# Patient Record
Sex: Male | Born: 2000 | Race: White | Hispanic: No | Marital: Single | Smoking: Never smoker
Health system: Southern US, Community
[De-identification: ages and names within clinical notes are randomized; demographics above are authoritative.]

## PROBLEM LIST (undated history)

## (undated) DIAGNOSIS — K900 Celiac disease: Secondary | ICD-10-CM

## (undated) DIAGNOSIS — D649 Anemia, unspecified: Secondary | ICD-10-CM

## (undated) DIAGNOSIS — Z91018 Allergy to other foods: Secondary | ICD-10-CM

## (undated) HISTORY — DX: Celiac disease: K90.0

## (undated) HISTORY — DX: Anemia, unspecified: D64.9

## (undated) HISTORY — DX: Allergy to other foods: Z91.018

---
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
## 2021-01-15 ENCOUNTER — Ambulatory Visit (HOSPITAL_COMMUNITY)
Admission: RE | Admit: 2021-01-15 | Discharge: 2021-01-15 | Disposition: A | Discharge status: Home / Self Care | Payer: Medicaid Other | Attending: Internal Medicine | Admitting: Internal Medicine

## 2021-01-15 ENCOUNTER — Encounter (HOSPITAL_COMMUNITY)
Admission: RE | Disposition: A | Discharge status: Home / Self Care | Payer: Self-pay | Source: Home / Self Care | Attending: Internal Medicine

## 2021-01-15 DIAGNOSIS — R109 Unspecified abdominal pain: Secondary | ICD-10-CM | POA: Insufficient documentation

## 2021-01-15 DIAGNOSIS — R933 Abnormal findings on diagnostic imaging of other parts of digestive tract: Secondary | ICD-10-CM | POA: Insufficient documentation

## 2021-01-15 DIAGNOSIS — G8929 Other chronic pain: Secondary | ICD-10-CM | POA: Diagnosis not present

## 2021-01-15 DIAGNOSIS — K900 Celiac disease: Secondary | ICD-10-CM

## 2021-01-15 HISTORY — PX: GIVENS CAPSULE STUDY: SHX5432

## 2021-01-15 SURGERY — IMAGING PROCEDURE, GI TRACT, INTRALUMINAL, VIA CAPSULE

## 2021-01-17 ENCOUNTER — Encounter (HOSPITAL_COMMUNITY): Payer: Self-pay | Admitting: Internal Medicine

## 2021-01-21 ENCOUNTER — Telehealth: Payer: Self-pay | Admitting: Internal Medicine

## 2021-01-21 ENCOUNTER — Other Ambulatory Visit: Payer: Self-pay

## 2021-01-21 DIAGNOSIS — K900 Celiac disease: Secondary | ICD-10-CM

## 2021-01-21 NOTE — Telephone Encounter (Signed)
Released lab orders and called pt to inform lab.

## 2021-01-21 NOTE — Telephone Encounter (Signed)
Pt said he was at [facility] labs and they dont have his lab orders. Fax [phone]

## 2021-01-22 ENCOUNTER — Telehealth: Payer: Self-pay | Admitting: Gastroenterology

## 2021-01-22 LAB — IGA: Immunoglobulin A: 235 mg/dL (ref 47–310)

## 2021-01-22 LAB — TISSUE TRANSGLUTAMINASE, IGA: (tTG) Ab, IgA: 22.2 U/mL — ABNORMAL HIGH

## 2021-01-22 NOTE — Telephone Encounter (Signed)
Gary [name], please let patient know I have reviewed his capsule report. I was waiting to look at a few images with Dr. Gala Romney.   No obvious abnormal villi seen. No obvious ulcerations or signs of inflammatory bowel disease. No mass appreciated. Full report to follow.  Brett Fairy.

## 2021-01-22 NOTE — Telephone Encounter (Signed)
noted 

## 2021-01-23 ENCOUNTER — Telehealth: Payer: Self-pay | Admitting: Internal Medicine

## 2021-01-23 NOTE — Telephone Encounter (Signed)
Patient called asking if his capsule study has been read and he also has a question about his labs.  Concerned because something is abnormal.

## 2021-01-23 NOTE — Telephone Encounter (Signed)
Spoke with Gary [name] and informed him that Katrine Coho and Dr. Gala Romney are reviewing all of his results and that we would be back in touch with him today with the final results. Gary [name] verbalized understanding.

## 2021-01-24 NOTE — Procedures (Addendum)
Small Bowel Givens Capsule Study Procedure date:  01/15/21  Referring Provider:  Tana Coast, PA-C/Dr. Jena Gauss  PCP:  Dr. Vernice Jefferson, Helane Rima, PA-C  Indication for procedure:   21 year old male with history of celiac disease via serologies (no confirmed biopsies), chronic abdominal pain, and diarrhea, presenting for capsule study due to history of abnormal distal small bowel on CT (somewhat nonspecific appearance but had plans for video capsule to evaluate for possible inflammatory bowel disease)    Findings:   Capsule was complete to the cecum. Ampulla readily recognizable. No obvious mass. No bleeding. No obvious ulcers or erosions.   First Gastric image:  00:00:45 First Duodenal image: 01:40:22 First Cecal image: 04:26:12 Gastric Passage time: 1h 67m Small Bowel Passage time:  2h 26m  Summary & Recommendations: Pleasant 21 year old male with presumed celiac disease due to positive serologies, chronic abdominal pain, and diarrhea, now with essentially unrevealing capsule study of small intestine. If persistent diarrhea despite gluten avoidance and supportive measures, recommend colonoscopy/EGD with biopsies.   Will arrange follow-up in the future as an outpatient. Continue gluten-free diet.   Gelene Mink, PhD, ANP-BC [facility]   Attending note: Pertinent images reviewed.  TTG IgA level remains elevated.  Agree with continuing treatment for celiac disease.

## 2021-01-24 NOTE — Telephone Encounter (Signed)
Pt was advised of this by Tammy and he also called back and spoke with me and I advised we would call when the Dr's have gotten together regarding the pt's next step. The pt expressed understanding.

## 2021-01-29 NOTE — Telephone Encounter (Signed)
Message sent to patient via mychart

## 2021-02-01 ENCOUNTER — Telehealth: Payer: Self-pay | Admitting: Gastroenterology

## 2021-02-01 DIAGNOSIS — K900 Celiac disease: Secondary | ICD-10-CM

## 2021-02-01 NOTE — Telephone Encounter (Signed)
[facility] in [place] should be able to see them in their system, but to be on the safe side I can mail the orders to him as well. What labs would you like to be ordered and what dx codes would you want to use?

## 2021-02-01 NOTE — Telephone Encounter (Signed)
He needs:   Test code [phone] (Endomysial Antibody (IgA) Screen with Reflex to Titer) Test code [phone] (HLA typing for Celiac Disease)  Dx: celiac disease, diarrhea, FH celiac disease   Tammy, [id number] is not in epic unfortunately.

## 2021-02-01 NOTE — Telephone Encounter (Signed)
See mychart message from patient.   He would like to have his labs done at [facility] in [place]. [address], [place] [address].  Can we make arrangements? Not sure if we need to fax that lab, mail him orders (he is currently at college), or if we just put in system and they can see it???

## 2021-02-01 NOTE — Addendum Note (Signed)
Addended by: Mahala Menghini on: 02/01/2021 04:41 PM   Modules accepted: Orders

## 2021-02-07 ENCOUNTER — Other Ambulatory Visit: Payer: Self-pay

## 2021-02-07 DIAGNOSIS — K900 Celiac disease: Secondary | ICD-10-CM

## 2021-02-07 NOTE — Telephone Encounter (Signed)
Spoke with rep from [facility] and was advised to order the requested test via prescription pad. Faxed both orders to [facility] with pt and he is scheduled to have them done on Friday 02/08/21.

## 2021-02-12 ENCOUNTER — Telehealth: Payer: Self-pay

## 2021-02-12 DIAGNOSIS — K900 Celiac disease: Secondary | ICD-10-CM

## 2021-02-12 NOTE — Telephone Encounter (Signed)
New referral placed.

## 2021-02-12 NOTE — Addendum Note (Signed)
Addended by: Cheron Every on: 02/12/2021 12:18 PM   Modules accepted: Orders

## 2021-02-12 NOTE — Telephone Encounter (Signed)
Patient has an upcoming appointment with nutrition and is needing an updated referral sent over. Gary [name] referred him to nutrition back in March. Is it ok to move forward with this?

## 2021-02-15 LAB — HLA TYPING FOR CELIAC DISEASE
HLA-DQ2: POSITIVE
HLA-DQ8: NEGATIVE
HLA-DQA1*: 2
HLA-DQA1*: 5
HLA-DQB1*: 201
HLA-DQB1*: 303

## 2021-02-15 LAB — ENDOMYSIAL AB IGA RFLX TITER: Endomysial Ab IgA: POSITIVE — AB

## 2021-02-15 LAB — ENDOMYSIAL AB IGA TITER: Endomysial Titer: 1:5 {titer} — ABNORMAL HIGH

## 2021-02-20 ENCOUNTER — Encounter: Payer: Medicaid Other | Admitting: Nutrition

## 2021-02-22 ENCOUNTER — Telehealth: Payer: Medicaid Other | Admitting: Physician Assistant

## 2021-02-22 DIAGNOSIS — R233 Spontaneous ecchymoses: Secondary | ICD-10-CM

## 2021-02-22 NOTE — [facility] Notes (Signed)
Virtual Visit Consent   Gary [name], you are scheduled for a virtual visit with a [facility] provider today.     Just as with appointments in the office, your consent must be obtained to participate.  Your consent will be active for this visit and any virtual visit you may have with one of our providers in the next 365 days.     If you have a MyChart account, a copy of this consent can be sent to you electronically.  All virtual visits are billed to your insurance company just like a traditional visit in the office.    As this is a virtual visit, video technology does not allow for your provider to perform a traditional examination.  This may limit your provider's ability to fully assess your condition.  If your provider identifies any concerns that need to be evaluated in person or the need to arrange testing (such as labs, EKG, etc.), we will make arrangements to do so.     Although advances in technology are sophisticated, we cannot ensure that it will always work on either your end or our end.  If the connection with a video visit is poor, the visit may have to be switched to a telephone visit.  With either a video or telephone visit, we are not always able to ensure that we have a secure connection.     I need to obtain your verbal consent now.   Are you willing to proceed with your visit today?    KRISHON LEITZKE has provided verbal consent on 02/22/2021 for a virtual visit (video or telephone).   Mar Daring, PA-C   Date: 02/22/2021 2:33 PM   Virtual Visit via Video Note   IMar Daring, connected with  DEVEN NIMTZ  ([id number], 07/18/2000) on 02/22/21 at  2:15 PM EST by a video-enabled telemedicine application and verified that I am speaking with the correct person using two identifiers.  Location: Patient: Virtual Visit Location Patient: Home Provider: Virtual Visit Location Provider: Home Office   I discussed the limitations of evaluation and management by  telemedicine and the availability of in person appointments. The patient expressed understanding and agreed to proceed.    History of Present Illness: Gary [name] is a 21 y.o. who identifies as a male who was assigned male at birth, and is being seen today for rash.  HPI: Rash Chronicity: reports it is a rash he has "always" had but they were brown, looked like freckles, but today noticed they changed to red. The current episode started today. The problem is unchanged. The affected locations include the left lower leg, left ankle, left foot, right ankle, right foot and right lower leg. The rash is characterized by redness. He was exposed to nothing. Pertinent negatives include no congestion, cough, fatigue, fever, joint pain, shortness of breath, sore throat or vomiting. Past treatments include nothing. The treatment provided no relief. His past medical history is significant for allergies (alpha gal). There is no history of asthma, eczema or varicella.  He does have Celiac Disease. Brother has history of HSP around same age that caused acute kidney failure and hospitalized him for 2 months.   Problems:  Patient Active Problem List   Diagnosis Date Noted   Lower abdominal pain 12/10/2020   Abnormal CT scan, small bowel 12/10/2020   RLQ abdominal pain 03/30/2020   Nausea without vomiting 03/30/2020   Nausea with vomiting 02/13/2020   Allergy to alpha-gal  Loss of weight    Celiac disease 02/23/2018   Other malaise and fatigue 02/23/2018   Body mass index, pediatric, 5th percentile to less than 85th percentile for age 64/12/2017    Allergies:  Allergies  Allergen Reactions   Gluten Meal    Other     An ingredient in cough medicine causes seizures     Medications:  Current Outpatient Medications:    amitriptyline (ELAVIL) 10 MG tablet, Take 5mg  at bedtime for 7 days, 10mg  at bedtime for 7 days, 15mg  at bedtime for 7 days, 20mg  at bedtime. If tolerated, we will work to 25mg  total  at bedtime daily., Disp: 60 tablet, Rfl: 1   hyoscyamine (LEVSIN SL) 0.125 MG SL tablet, Place 1 tablet (0.125 mg total) under the tongue every 6 (six) hours as needed. For lower abdominal pain and diarrhea, Disp: 120 tablet, Rfl: 2   ondansetron (ZOFRAN) 4 MG tablet, Take 1 tablet (4 mg total) by mouth every 8 (eight) hours as needed for nausea or vomiting., Disp: 15 tablet, Rfl: 0  Observations/Objective: Patient is well-developed, well-nourished in no acute distress.  Resting comfortably at home.  Head is normocephalic, atraumatic.  No labored breathing.  Speech is clear and coherent with logical content.  Patient is alert and oriented at baseline.  Picture available in Epic shows small, flat, red, pin point rash consistent with petechiae. Upon exam he reports the rash did not blanch with pressure  Assessment and Plan: 1. Petechiae  - Advised him to seek evaluation at local UC for lab work, particularly CBC; He agrees  Follow Up Instructions: I discussed the assessment and treatment plan with the patient. The patient was provided an opportunity to ask questions and all were answered. The patient agreed with the plan and demonstrated an understanding of the instructions.  A copy of instructions were sent to the patient via MyChart unless otherwise noted below.   The patient was advised to call back or seek an in-person evaluation if the symptoms worsen or if the condition fails to improve as anticipated.  Time:  I spent 15 minutes with the patient via telehealth technology discussing the above problems/concerns.    Mar Daring, PA-C

## 2021-02-22 NOTE — Patient Instructions (Signed)
Mady Haagensen, thank you for joining Mar Daring, PA-C for today's virtual visit.  While this provider is not your primary care provider (PCP), if your PCP is located in our provider database this encounter information will be shared with them immediately following your visit.  Consent: (Patient) Mady Haagensen provided verbal consent for this virtual visit at the beginning of the encounter.  Current Medications:  Current Outpatient Medications:    amitriptyline (ELAVIL) 10 MG tablet, Take 5mg  at bedtime for 7 days, 10mg  at bedtime for 7 days, 15mg  at bedtime for 7 days, 20mg  at bedtime. If tolerated, we will work to 25mg  total at bedtime daily., Disp: 60 tablet, Rfl: 1   hyoscyamine (LEVSIN SL) 0.125 MG SL tablet, Place 1 tablet (0.125 mg total) under the tongue every 6 (six) hours as needed. For lower abdominal pain and diarrhea, Disp: 120 tablet, Rfl: 2   ondansetron (ZOFRAN) 4 MG tablet, Take 1 tablet (4 mg total) by mouth every 8 (eight) hours as needed for nausea or vomiting., Disp: 15 tablet, Rfl: 0   Medications ordered in this encounter:  No orders of the defined types were placed in this encounter.    *If you need refills on other medications prior to your next appointment, please contact your pharmacy*  Follow-Up: Call back or seek an in-person evaluation if the symptoms worsen or if the condition fails to improve as anticipated.  Other Instructions Based on what you shared with me, I feel your condition warrants further evaluation and I recommend that you be seen in a face to face visit.  If you are having a true medical emergency please call 911.      For an urgent face to face visit, [place] has six urgent care centers for your convenience:     [facility] [place] Get Driving Directions [id number] [place], [place] [address]    [facility] [place] [facility]) Get Driving  Directions [id number] [place], [place] [address]  [facility] [place] [facility]) Get Driving Directions [id number] [address] [place],  [place]  [address]  [facility]  Get Driving Directions [id number] [place], [place], [place] [address]   [facility] [place] Get Driving Directions  [id number] [address] [place], [place] [address]   [facility] [place] Get Driving Directions [id number] [address], [place], [place] [address]  Petechiae Petechiae are pinpoint-sized spots of bleeding under the skin or mucous membranes. The purple, red or brown dots are not raised or itchy, and theyre not a rash. Many different things can cause petechiae, and some are serious. If you or your child have petechiae that spread quickly, or if you have dots plus other symptoms, seek medical attention. Questions [phone] APPOINTMENTS & LOCATIONS REQUEST AN APPOINTMENT FIND A PRIMARY CARE PROVIDER Possible Causes Care and Treatment When to Call the Doctor OVERVIEW What are petechiae? Petechiae are tiny spots of bleeding under the skin or in the mucous membranes (mouth or eyelids). They are purple, red or brown dots, each about the size of a pinpoint. Theyre not raised or bumpy.  Are petechiae a rash? Petechiae may look like a rash, but theyre not. These pinpoint red dots on the skin are caused by broken capillaries, tiny blood vessels under the skin. They are not itchy  or painful.  If you press on petechiae, theyll stay purple, red or brown. But if you press on a rash, it will turn pale or lighter.  Where can petechiae happen? Petechiae can appear anywhere on the body but are usually found on or in the:  Arms. Butt. Inside the eyelids. Legs. Mouth. Stomach. POSSIBLE CAUSES What are the possible  causes of petechiae? Several things can lead to petechiae, ranging from simple and reversible causes to serious illnesses:  Endocarditis: Endocarditis is an infection in the lining of the heart. Other signs include fever, chills, fatigue, body aches and shortness of breath. Infection: Illnesses from bacteria, such as strep throat with scarlet fever, or [facility] spotted fever (spread by ticks) can cause petechiae. So can viral infections, such as cytomegalovirus or hantavirus. Other signs of infection may include fatigue, fever, sore throat, swollen glands and tonsils, body aches, nausea and vomiting. Injury: Damage to the skin can cause petechiae. Examples include a car accident, bite, friction on the skin or even sunburn. Leukemia: Leukemia is cancer in the blood and bone marrow. Other signs of this disease may include weight loss, swollen glands, easy bleeding or bruising, nosebleeds and night sweats. Medications: Some medications may cause petechiae, including certain antibiotics, antidepressants and medications that thin the blood. Mononucleosis: Also called mono, this viral infection is common among young people. It often causes fatigue, headache, sore throat, swollen glands and tonsils, and fever. Straining: When you strain, you can break blood vessels under the skin. Examples include when youre throwing up, lifting something very heavy or giving birth. Thrombocytopenia: With thrombocytopenia, you have low levels of platelets, which help your blood clot. It may also cause easy bruising, bloody noses or gums, blood in pee or poop, and yellowish skin and eyes. Vasculitis: Vasculitis is inflammation (swelling) in the blood vessels. It also causes fever, headache, weight loss and nerve problems (pain, weakness or numbness). Viral hemorrhagic fevers: Viral hemorrhagic fevers, such as Ebola and dengue fever, make it hard for the blood to clot. Other symptoms may include high fever, easy bruising  or bleeding, body aches and weakness. Vitamin C deficiency: When your body doesnt get enough vitamin C, you can develop scurvy. Other signs include swollen gums, achy joints, easy bruising and shortness of breath. CARE AND TREATMENT How are petechiae treated? Treatment for petechiae varies depending on the cause. For straining or a skin injury, you may not need any treatment. If there is a more serious cause, you may need:  Antibiotics to treat a bacterial infection. Chemotherapy, radiation, immunotherapy or bone marrow transplant to treat leukemia. Corticosteroids to reduce swelling in the blood vessels. Drugs that suppress the immune system. Vitamin C supplements. What can I do at home to treat petechiae? If you have petechiae, you should call your doctor. Some home remedies that may help include:  Cold compresses. Lots of fluids. Nonsteroidal anti-inflammatory drugs (NSAIDs). Rest. How can I prevent petechiae? Its not possible to prevent all causes of petechiae. But you can help prevent infections that lead to petechiae with some simple strategies:  Avoid anyone whos sick. Clean countertops, door handles and other high-touch surfaces frequently. Dont share items that may have touched someone elses mouth or nose (like a cup or toothbrush). Protect your skin from sun damage with clothing, sunscreen and shade. Use insect repellant in grassy areas and the woods to prevent tick bites. Also, wear long-sleeved shirts and pants, and make sure to check your body for ticks afterward. Wash your hands  often. WHEN TO CALL THE DOCTOR When should I call my doctor? Petechiae can be a sign of a severe illness or medical emergency, especially in children. Seek medical attention if you have pinpoint red dots on the skin and:  Confusion, dizziness or loss of consciousness (syncope, or passing out). Fever. Spots that spread quickly. Trouble breathing. A note from [place]  are tiny spots of bleeding under the skin. They can be caused by a simple injury, straining or more serious conditions. If you have pinpoint-sized red dots under your skin that spread quickly, or petechiae plus other symptoms, seek medical attention.   If you have been instructed to have an in-person evaluation today at a local Urgent Care facility, please use the link below. It will take you to a list of all of our available [facility], including address, phone number and hours of operation. Please do not delay care.  [facility]  If you or a family member do not have a primary care provider, use the link below to schedule a visit and establish care. When you choose a [facility] primary care physician or advanced practice provider, you gain a long-term partner in health. Find a Primary Care Provider  Learn more about [facility] in-office and virtual care options: [facility]

## 2021-02-28 NOTE — Telephone Encounter (Signed)
Spoke to patient. Discussed his concerns regarding petechiae rash. Has been present since age 21 but spots more red lately. No itching. No pain. Has been running a lot up until recently due to knee pain. Otherwise he feels great, the best he has felt in a long time.   Offered to have him see dermatologist for biopsy to confirm etiology of rash. 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 He wants to wait until he comes home from school.   If he notes any changes, he will call sooner.

## 2021-03-06 ENCOUNTER — Encounter: Payer: Medicaid Other | Attending: Internal Medicine | Admitting: Nutrition

## 2021-03-08 ENCOUNTER — Other Ambulatory Visit: Payer: Self-pay | Admitting: Gastroenterology

## 2021-03-24 ENCOUNTER — Other Ambulatory Visit: Payer: Self-pay | Admitting: Gastroenterology

## 2021-03-25 NOTE — Telephone Encounter (Signed)
Last video visit 12/10/20 ?

## 2021-04-29 ENCOUNTER — Encounter: Payer: Self-pay | Admitting: Internal Medicine

## 2021-06-04 ENCOUNTER — Encounter: Payer: Self-pay | Admitting: Internal Medicine

## 2021-06-04 ENCOUNTER — Ambulatory Visit (INDEPENDENT_AMBULATORY_CARE_PROVIDER_SITE_OTHER): Payer: Medicaid Other | Admitting: Internal Medicine

## 2021-06-04 VITALS — BP 120/80 | HR 106 | Temp 98.0°F | Ht 73.0 in | Wt 138.2 lb

## 2021-06-04 DIAGNOSIS — K900 Celiac disease: Secondary | ICD-10-CM | POA: Diagnosis not present

## 2021-06-04 NOTE — Patient Instructions (Signed)
It was good to see you again today!  Your diagnosis of celiac disease is well established -short of duodenal biopsies.  Skin condition may or may not be related to celiac disease  You are doing an awesome job regarding gluten exposure  I agree with Dr. Margo Aye, if the rash worsens, it should be biopsied.  You need no further GI evaluation at this time other than returning for follow-up in 1 year.  At that time, we will repeat the TTG IgA titer.  Of course, should you have any interim problems, please do not hesitate to call.

## 2021-06-04 NOTE — Progress Notes (Unsigned)
Primary Care Physician:  Royann Shivers, PA-C Primary Gastroenterologist:  Dr.   Pre-Procedure History & Physical: HPI:  Gary [name] is a 21 y.o. 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  He is virtually devoid of any GI tract symptoms.  Takes amitriptyline 10 mg nightly.  Really takes hyoscyamine.  Weight is stable.  He is quite physically active bikes and runs many miles weekly.  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  He just graduated from [employer] with a degree in criminal justice. Has had a rash on his lower extremities for which he is seeing Dr. Margo Aye.  It is felt that it could be a vasculitis.  Biopsy is not recommended unless it gets worse.  Not clearly dermatitis herpetiformis. Patient reminded me that his father has both rheumatoid arthritis and psoriasis.  Past Medical History:  Diagnosis Date   Allergy to alpha-gal    Anemia    Celiac disease     Past Surgical History:  Procedure Laterality Date   AGILE CAPSULE N/A 08/23/2020   Procedure: AGILE CAPSULE;  Surgeon: Corbin Ade, MD;  [facility];  Service: Endoscopy;  Laterality: N/A;  7:30am   GIVENS CAPSULE STUDY N/A 01/15/2021   Procedure: GIVENS CAPSULE STUDY;  Surgeon: Corbin Ade, MD;  Location[facility];  Service: Endoscopy;  Laterality: N/A;  7:30am    Prior to Admission medications   Medication Sig Start Date End Date Taking? Authorizing Provider  amitriptyline (ELAVIL) 10 MG tablet TAKE 1/2 TABLET AT BEDTIME FOR 7 DAYS, 1 TABLET AT BEDTIME FOR 7 DAYS, 1.5 TABLETS AT BEDTIME FOR 7 DAYS, 2 TABLETS AT BEDTIME. IF TOLERATED, WE WILL WORK TO 25MG  TOTAL AT BEDTIME DAILY. 03/25/21  Yes Tiffany Kocher, PA-C  hyoscyamine (LEVSIN SL) 0.125 MG SL tablet Place 1 tablet (0.125 mg total) under the tongue every 6 (six) hours as needed. For lower abdominal pain and diarrhea 10/26/20  Yes Ermalinda Memos S,  PA-C    Allergies as of 06/04/2021 - Review Complete 06/04/2021  Allergen Reaction Noted   Gluten meal  09/05/2019   Other  09/05/2019    Family History  Problem Relation Age of Onset   Lupus Mother    Psoriasis Father    Celiac disease Brother    Irritable bowel syndrome Brother    Lupus Paternal Grandfather    Henoch-Schonlein purpura Brother    Colon cancer Neg Hx    Crohn's disease Neg Hx     Social History   Socioeconomic History   Marital status: Single    Spouse name: Not on file   Number of children: Not on file   Years of education: Not on file   Highest education level: Not on file  Occupational History   Not on file  Tobacco Use   Smoking status: Never   Smokeless tobacco: Never  Substance and Sexual Activity   Alcohol use: Never   Drug use: Never   Sexual activity: Yes  Other Topics Concern   Not on file  Social History Narrative   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. Lives with mom and dad. Inside dog and 1 outside dog   Social Determinants of Health   Financial Resource Strain: Not on file  Food Insecurity: Not on file  Transportation Needs: Not on file  Physical Activity: Not on file  Stress: Not on file  Social Connections: Not on file  Intimate Partner Violence: Not on file    Review of Systems: See HPI, otherwise negative ROS  Physical Exam: BP 120/80 (BP Location: Left Arm, Patient Position: Sitting, Cuff Size: Normal)   Pulse (!) 106   Temp 98 F (36.7 C) (Temporal)   Ht 6\' 1"  (1.854 m)   Wt 138 lb 3.2 oz (62.7 kg)   SpO2 99%   BMI 18.23 kg/m  General:   Alert,  Well-developed, well-nourished, pleasant and cooperative in NAD   Impression/Plan: Short of duodenal biopsies, it is my impression that Gary [name] does have celiac disease.  He has responded nicely to a gluten-free diet.  He is highly attuned into gluten avoidance.  He seen the nutritionist earlier this year.  He virtually has no no GI symptoms.  Skin rash may  or may not be related to celiac disease. We discussed the fact that he may have other autoimmune tendencies given the presence of celiac disease in his family history. He has had persistently positive TTG IgA titers over the past year.  It may take a while for them to nadir on a gluten-free diet.  Recommendations: Your diagnosis of celiac disease is well established -short of duodenal biopsies.  Skin condition may or may not be related to celiac disease  You are doing an awesome job regarding gluten exposure  I agree with Dr. Nevada Crane, if the rash worsens, it should be biopsied.  You need no further GI evaluation at this time other than returning for follow-up in 1 year.  At that time, we will repeat the TTG IgA titer.  Of course, should you have any interim problems, please do not hesitate to call.     Notice: This dictation was prepared with Dragon dictation along with smaller phrase technology. Any transcriptional errors that result from this process are unintentional and may not be corrected upon review.

## 2021-08-09 ENCOUNTER — Encounter: Payer: Self-pay | Admitting: Gastroenterology

## 2021-10-03 ENCOUNTER — Telehealth: Payer: Self-pay

## 2021-10-03 NOTE — Telephone Encounter (Signed)
Patient is requesting refills on Levsin. Last ov was 06/04/21. [facility].

## 2021-10-24 ENCOUNTER — Telehealth: Payer: Medicaid Other | Admitting: Physician Assistant

## 2021-10-24 DIAGNOSIS — R202 Paresthesia of skin: Secondary | ICD-10-CM

## 2021-10-24 DIAGNOSIS — T50Z95A Adverse effect of other vaccines and biological substances, initial encounter: Secondary | ICD-10-CM | POA: Diagnosis not present

## 2021-10-24 NOTE — Patient Instructions (Signed)
Gary [name], thank you for joining Mar Daring, PA-C for today's virtual visit.  While this provider is not your primary care provider (PCP), if your PCP is located in our provider database this encounter information will be shared with them immediately following your visit.  Consent: (Patient) Gary [name] provided verbal consent for this virtual visit at the beginning of the encounter.  Current Medications:  Current Outpatient Medications:    amitriptyline (ELAVIL) 10 MG tablet, TAKE 1/2 TABLET AT BEDTIME FOR 7 DAYS, 1 TABLET AT BEDTIME FOR 7 DAYS, 1.5 TABLETS AT BEDTIME FOR 7 DAYS, 2 TABLETS AT BEDTIME. IF TOLERATED, WE WILL WORK TO 25MG TOTAL AT BEDTIME DAILY., Disp: 180 tablet, Rfl: 2   hyoscyamine (LEVSIN SL) 0.125 MG SL tablet, Place 1 tablet (0.125 mg total) under the tongue every 6 (six) hours as needed. For lower abdominal pain and diarrhea, Disp: 120 tablet, Rfl: 2   Medications ordered in this encounter:  No orders of the defined types were placed in this encounter.    *If you need refills on other medications prior to your next appointment, please contact your pharmacy*  Follow-Up: Call back or seek an in-person evaluation if the symptoms worsen or if the condition fails to improve as anticipated.  [employer] [phone]  Other Instructions  Influenza (Flu) Vaccine (Inactivated or Recombinant): What You Need to Know 1. Why get vaccinated? Influenza vaccine can prevent influenza (flu). Flu is a contagious disease that spreads around the [place] every year, usually between October and May. Anyone can get the flu, but it is more dangerous for some people. Infants and young children, people 42 years and older, pregnant people, and people with certain health conditions or a weakened immune system are at greatest risk of flu complications. Pneumonia, bronchitis, sinus infections, and ear infections are examples of flu-related complications. If  you have a medical condition, such as heart disease, cancer, or diabetes, flu can make it worse. Flu can cause fever and chills, sore throat, muscle aches, fatigue, cough, headache, and runny or stuffy nose. Some people may have vomiting and diarrhea, though this is more common in children than adults. In an average year, thousands of people in the [place] die from flu, and many more are hospitalized. Flu vaccine prevents millions of illnesses and flu-related visits to the doctor each year. 2. Influenza vaccines CDC recommends everyone 6 months and older get vaccinated every flu season. Children 6 months through 13 years of age may need 2 doses during a single flu season. Everyone else needs only 1 dose each flu season. It takes about 2 weeks for protection to develop after vaccination. There are many flu viruses, and they are always changing. Each year a new flu vaccine is made to protect against the influenza viruses believed to be likely to cause disease in the upcoming flu season. Even when the vaccine doesn't exactly match these viruses, it may still provide some protection. Influenza vaccine does not cause flu. Influenza vaccine may be given at the same time as other vaccines. 3. Talk with your health care provider Tell your vaccination provider if the person getting the vaccine: Has had an allergic reaction after a previous dose of influenza vaccine, or has any severe, life-threatening allergies Has ever had Guillain-Barr Syndrome (also called "GBS") In some cases, your health care provider may decide to postpone influenza vaccination until a future visit. Influenza vaccine can be administered at any time during pregnancy. People  who are or will be pregnant during influenza season should receive inactivated influenza vaccine. People with minor illnesses, such as a cold, may be vaccinated. People who are moderately or severely ill should usually wait until they recover before getting  influenza vaccine. Your health care provider can give you more information. 4. Risks of a vaccine reaction Soreness, redness, and swelling where the shot is given, fever, muscle aches, and headache can happen after influenza vaccination. There may be a very small increased risk of Guillain-Barr Syndrome (GBS) after inactivated influenza vaccine (the flu shot). Young children who get the flu shot along with pneumococcal vaccine (PCV13) and/or DTaP vaccine at the same time might be slightly more likely to have a seizure caused by fever. Tell your health care provider if a child who is getting flu vaccine has ever had a seizure. People sometimes faint after medical procedures, including vaccination. Tell your provider if you feel dizzy or have vision changes or ringing in the ears. As with any medicine, there is a very remote chance of a vaccine causing a severe allergic reaction, other serious injury, or death. 5. What if there is a serious problem? An allergic reaction could occur after the vaccinated person leaves the clinic. If you see signs of a severe allergic reaction (hives, swelling of the face and throat, difficulty breathing, a fast heartbeat, dizziness, or weakness), call 9-1-1 and get the person to the nearest hospital. For other signs that concern you, call your health care provider. Adverse reactions should be reported to the Vaccine Adverse Event Reporting System (VAERS). Your health care provider will usually file this report, or you can do it yourself. Visit the VAERS website at www.vaers.SamedayNews.es or call 5147856411. VAERS is only for reporting reactions, and VAERS staff members do not give medical advice. 6. The National Vaccine Injury Compensation Program The [employer] Vaccine Injury Compensation Program (VICP) is a federal program that was created to compensate people who may have been injured by certain vaccines. Claims regarding alleged injury or death due to vaccination have a  time limit for filing, which may be as short as two years. Visit the VICP website at GoldCloset.com.ee or call (406) 514-2153 to learn about the program and about filing a claim. 7. How can I learn more? Ask your health care provider. Call your local or state health department. Visit the website of the Food and Drug Administration (FDA) for vaccine package inserts and additional information at TraderRating.uy. Contact the Centers for Disease Control and Prevention (CDC): Call 225-351-0453 (1-800-CDC-INFO) or Visit CDC's website at https://gibson.com/. Source: CDC Vaccine Information Statement Inactivated Influenza Vaccine (08/19/2019) This same material is available at http://www.wolf.info/ for no charge. This information is not intended to replace advice given to you by your health care provider. Make sure you discuss any questions you have with your health care provider. Document Revised: 11/28/2020 Document Reviewed: 09/20/2020 Elsevier Patient Education  [place].    If you have been instructed to have an in-person evaluation today at a local Urgent Care facility, please use the link below. It will take you to a list of all of our available [facility], including address, phone number and hours of operation. Please do not delay care.  [facility]  If you or a family member do not have a primary care provider, use the link below to schedule a visit and establish care. When you choose a  primary care physician or advanced practice provider, you gain a  long-term partner in health. Find a Primary Care Provider  Learn more about [facility] in-office and virtual care options: [facility]

## 2021-10-24 NOTE — Progress Notes (Signed)
Virtual Visit Consent   Gary [name], you are scheduled for a virtual visit with a [place] provider today. Just as with appointments in the office, your consent must be obtained to participate. Your consent will be active for this visit and any virtual visit you may have with one of our providers in the next 365 days. If you have a MyChart account, a copy of this consent can be sent to you electronically.  As this is a virtual visit, video technology does not allow for your provider to perform a traditional examination. This may limit your provider's ability to fully assess your condition. If your provider identifies any concerns that need to be evaluated in person or the need to arrange testing (such as labs, EKG, etc.), we will make arrangements to do so. Although advances in technology are sophisticated, we cannot ensure that it will always work on either your end or our end. If the connection with a video visit is poor, the visit may have to be switched to a telephone visit. With either a video or telephone visit, we are not always able to ensure that we have a secure connection.  By engaging in this virtual visit, you consent to the provision of healthcare and authorize for your insurance to be billed (if applicable) for the services provided during this visit. Depending on your insurance coverage, you may receive a charge related to this service.  I need to obtain your verbal consent now. Are you willing to proceed with your visit today? Gary [name] has provided verbal consent on 10/24/2021 for a virtual visit (video or telephone). Mar Daring, PA-C  Date: 10/24/2021 5:27 PM  Virtual Visit via Video Note   I, Mar Daring, connected with  Gary [name]  ([id number], [birth year]) on 10/24/21 at  5:15 PM EDT by a video-enabled telemedicine application and verified that I am speaking with the correct person using two identifiers.  Location: Patient: Virtual Visit Location  Patient: Home Provider: Virtual Visit Location Provider: Home Office   I discussed the limitations of evaluation and management by telemedicine and the availability of in person appointments. The patient expressed understanding and agreed to proceed.    History of Present Illness: Gary [name] is a 21 y.o. who identifies as a male who was assigned male at birth, and is being seen today for possible vaccine reaction. Received a flu vaccine today and reports that the vaccine was given low in the arm, near mid arm. When he received the vaccine he does mention his hand turned inward toward his body, but did not have any excessive pain more than any other vaccine. He does continue to have some tingling in the arm and down towards the hand, denies pain. He has been testing his sensation by touch and pinch and notes no differences. Also continuing to move arm without issue.    Problems:  Patient Active Problem List   Diagnosis Date Noted   Lower abdominal pain 12/10/2020   Abnormal CT scan, small bowel 12/10/2020   RLQ abdominal pain 03/30/2020   Nausea without vomiting 03/30/2020   Nausea with vomiting 02/13/2020   Allergy to alpha-gal    Loss of weight    Celiac disease 02/23/2018   Other malaise and fatigue 02/23/2018   Body mass index, pediatric, 5th percentile to less than 85th percentile for age 60/12/2017    Allergies:  Allergies  Allergen Reactions   Gluten Meal    Other  An ingredient in cough medicine causes seizures     Medications:  Current Outpatient Medications:    amitriptyline (ELAVIL) 10 MG tablet, TAKE 1/2 TABLET AT BEDTIME FOR 7 DAYS, 1 TABLET AT BEDTIME FOR 7 DAYS, 1.5 TABLETS AT BEDTIME FOR 7 DAYS, 2 TABLETS AT BEDTIME. IF TOLERATED, WE WILL WORK TO 25MG TOTAL AT BEDTIME DAILY., Disp: 180 tablet, Rfl: 2   hyoscyamine (LEVSIN SL) 0.125 MG SL tablet, Place 1 tablet (0.125 mg total) under the tongue every 6 (six) hours as needed. For lower abdominal pain and  diarrhea, Disp: 120 tablet, Rfl: 2  Observations/Objective: Patient is well-developed, well-nourished in no acute distress.  Resting comfortably at home.  [name] is normocephalic, atraumatic.  No labored breathing.  Speech is clear and coherent with logical content.  Patient is alert and oriented at baseline.  Normal ROM Patient reports neurovascular grossly intact  Assessment and Plan: 1. Adverse effect of vaccine, initial encounter  - Suspect medication was injected near a sensory nerve causing symptoms - No pain so do not expect any direct nerve injury - Advised to monitor for any progressive numbness, weakness, or loss of function; if so, seek immediate medical attention - May use ice  - Tylenol and/or ibuprofen as needed - Continue to move arm and hand with ROM exercises - Expect full recovery and symptom resolution in next 48-72 hours - Follow up in person if worsening or fails to improve  Follow Up Instructions: I discussed the assessment and treatment plan with the patient. The patient was provided an opportunity to ask questions and all were answered. The patient agreed with the plan and demonstrated an understanding of the instructions.  A copy of instructions were sent to the patient via MyChart unless otherwise noted below.    The patient was advised to call back or seek an in-person evaluation if the symptoms worsen or if the condition fails to improve as anticipated.  Time:  I spent 12 minutes with the patient via telehealth technology discussing the above problems/concerns.    Mar Daring, PA-C

## 2021-10-30 ENCOUNTER — Other Ambulatory Visit: Payer: Self-pay | Admitting: Gastroenterology

## 2021-11-04 ENCOUNTER — Other Ambulatory Visit: Payer: Self-pay

## 2021-11-04 MED ORDER — AMITRIPTYLINE HCL 10 MG PO TABS: 10.0000 mg | ORAL_TABLET | Freq: Every day | ORAL | 3 refills | Status: DC

## 2021-11-19 ENCOUNTER — Other Ambulatory Visit: Payer: Self-pay

## 2021-11-19 MED ORDER — AMITRIPTYLINE HCL 10 MG PO TABS: 10.0000 mg | ORAL_TABLET | Freq: Every day | ORAL | 0 refills | Status: DC

## 2022-02-25 ENCOUNTER — Other Ambulatory Visit: Payer: Self-pay | Admitting: Internal Medicine

## 2022-02-26 NOTE — Telephone Encounter (Signed)
Pt needs an office visit for further refills with an App in 3 mths.

## 2022-04-15 ENCOUNTER — Encounter: Payer: Self-pay | Admitting: Internal Medicine

## 2022-04-29 ENCOUNTER — Telehealth: Payer: Self-pay

## 2022-04-29 MED ORDER — HYOSCYAMINE SULFATE 0.125 MG SL SUBL: 0.1250 mg | SUBLINGUAL_TABLET | Freq: Four times a day (QID) | SUBLINGUAL | 5 refills | Status: DC | PRN

## 2022-04-29 MED ORDER — ONDANSETRON 4 MG PO TBDP: 4.0000 mg | ORAL_TABLET | Freq: Four times a day (QID) | ORAL | 1 refills | Status: AC | PRN

## 2022-04-29 NOTE — Telephone Encounter (Signed)
Does he want it sent to [facility]?

## 2022-04-29 NOTE — Addendum Note (Signed)
Addended by: Tiffany Kocher on: 04/29/2022 08:46 PM   Modules accepted: Orders

## 2022-04-29 NOTE — Telephone Encounter (Signed)
Pt  called requesting refills on levsin and zofran. Pt is also requesting the sublinqual zofran to be called in. Pt was last seen on 06/04/2021 by Dr. Jena Gauss. Routing to you in his absence.

## 2022-05-25 ENCOUNTER — Other Ambulatory Visit: Payer: Self-pay | Admitting: Internal Medicine

## 2022-05-26 ENCOUNTER — Other Ambulatory Visit: Payer: Self-pay

## 2022-05-26 MED ORDER — AMITRIPTYLINE HCL 10 MG PO TABS: ORAL_TABLET | ORAL | 0 refills | Status: DC

## 2022-05-26 MED ORDER — HYOSCYAMINE SULFATE 0.125 MG SL SUBL: 0.1250 mg | SUBLINGUAL_TABLET | Freq: Four times a day (QID) | SUBLINGUAL | 3 refills | Status: DC | PRN

## 2022-08-24 ENCOUNTER — Other Ambulatory Visit: Payer: Self-pay | Admitting: Internal Medicine

## 2022-08-25 NOTE — Telephone Encounter (Signed)
Needs an appt with an app for further refills

## 2022-09-03 IMAGING — CT CT ABD-PELV W/ CM
2 of 4 series · 16 of 46 positions shown, 18 images · IV contrast (Omnipaque or Isovue)
Comparison: None.

CLINICAL DATA: Right lower quadrant abdominal pain for 3 days.

EXAM:
CT ABDOMEN AND PELVIS WITH CONTRAST
TECHNIQUE: Multidetector CT imaging of the abdomen and pelvis was performed
using the standard protocol following bolus administration of
intravenous contrast.
CONTRAST:  75mL OMNIPAQUE IOHEXOL 300 MG/ML  SOLN

[Series 2: axial st · axial · 0.66mm/px · z∈[+711,+1191]mm · 13 of 106 slices shown, 15 images]
[im 5/106  soft-tissue]
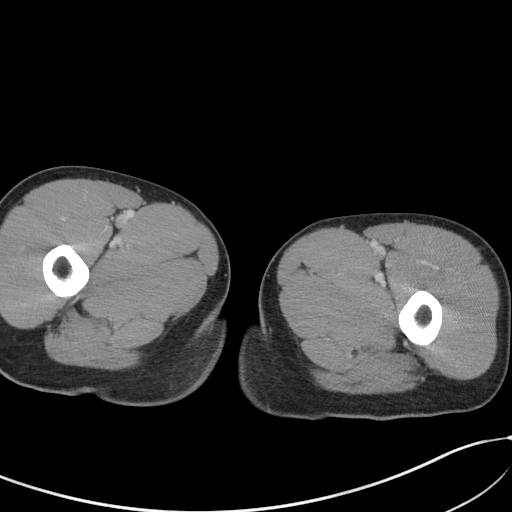
[im 5/106  bone]
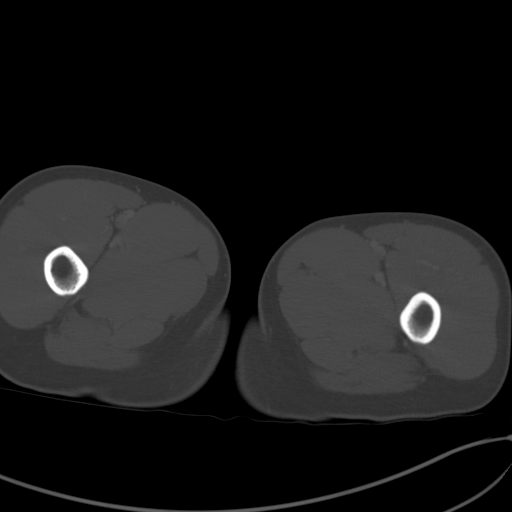
[im 14/106  soft-tissue]
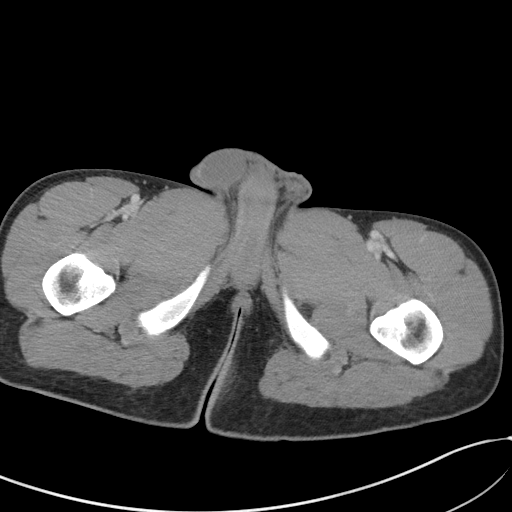
[im 22/106  soft-tissue]
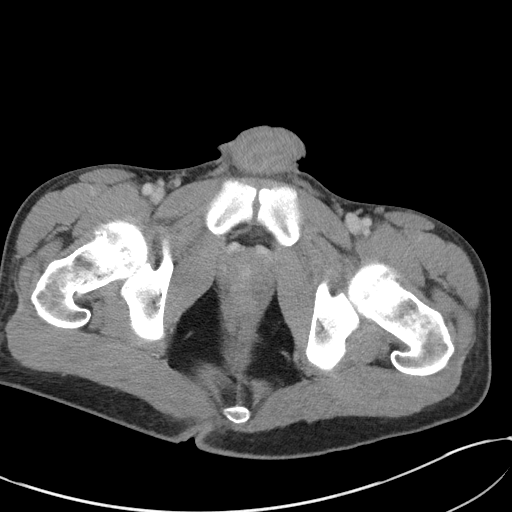
[im 31/106  soft-tissue]
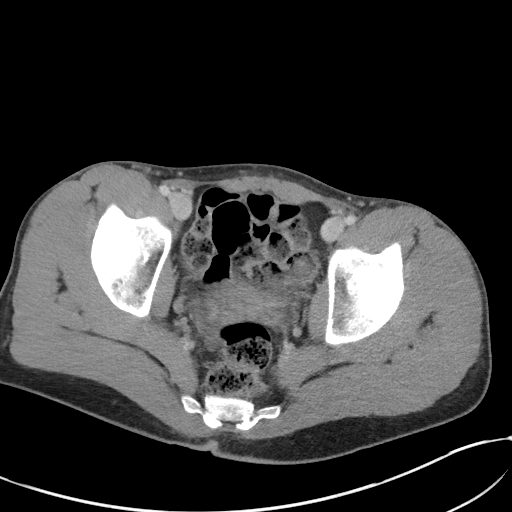
[im 36/106  soft-tissue]
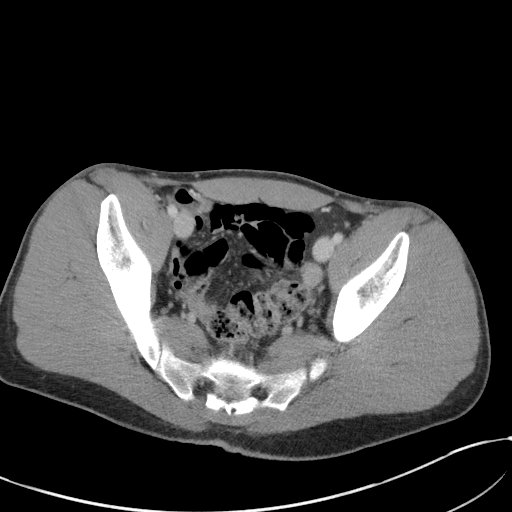
[im 44/106  soft-tissue]
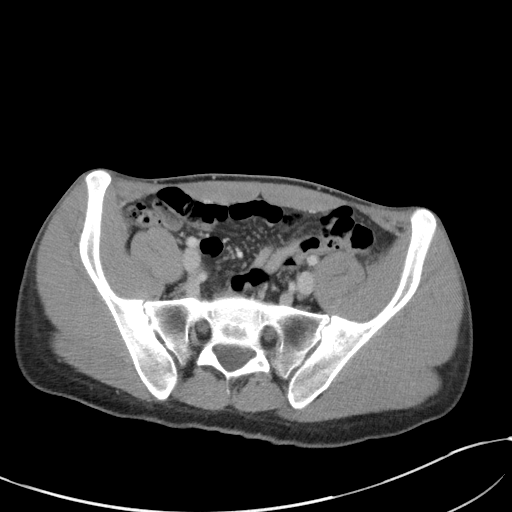
[im 53/106  soft-tissue]
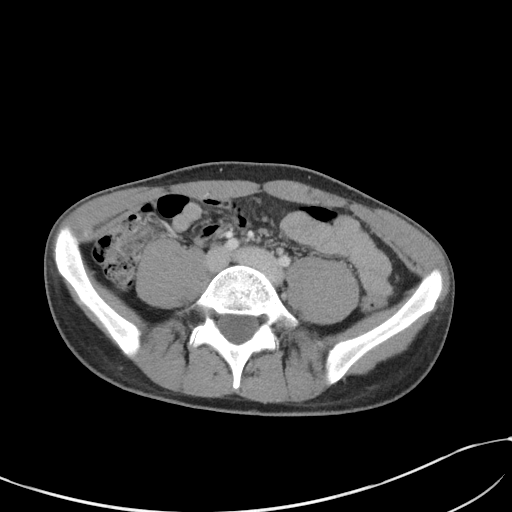
[im 62/106  soft-tissue]
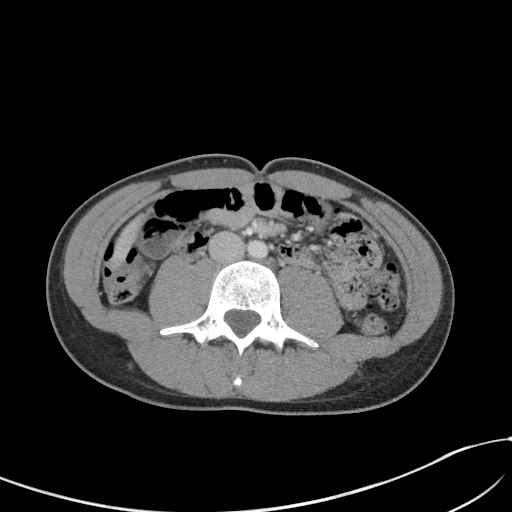
[im 71/106  soft-tissue]
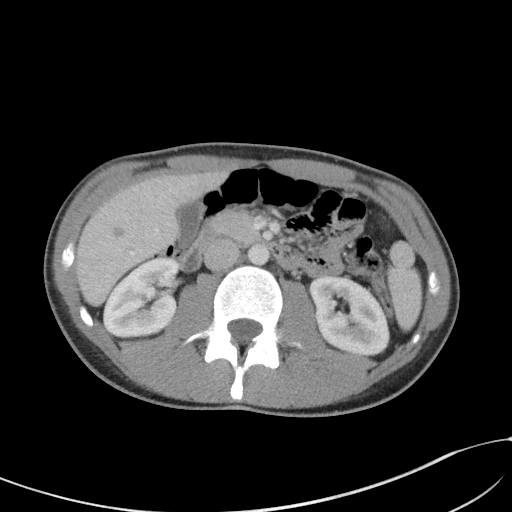
[im 71/106  bone]
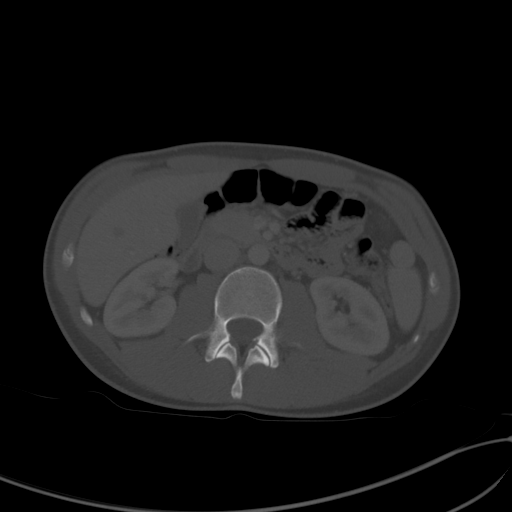
[im 75/106  soft-tissue]
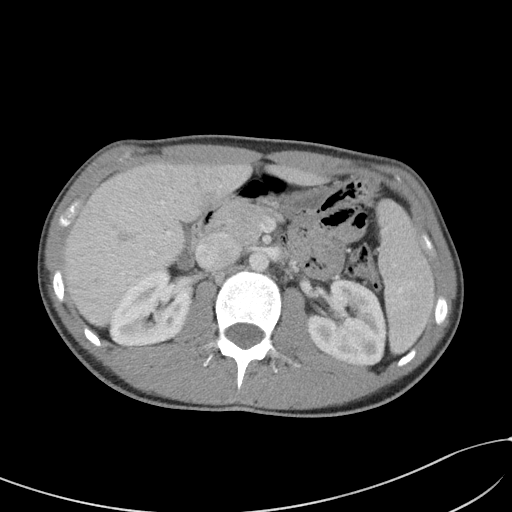
[im 84/106  soft-tissue]
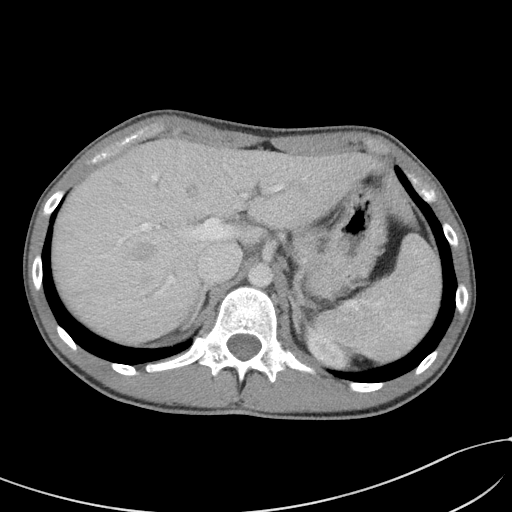
[im 92/106  soft-tissue]
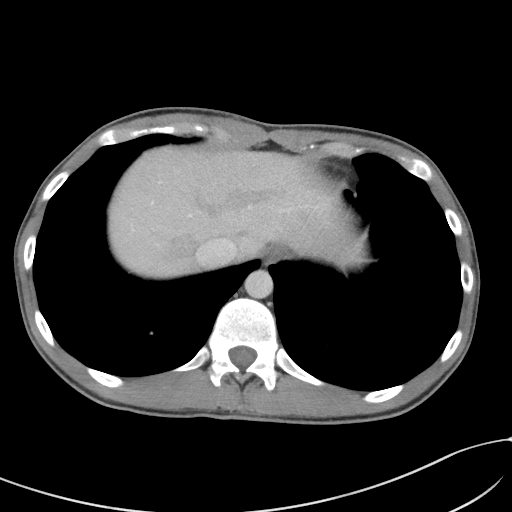
[im 101/106  soft-tissue]
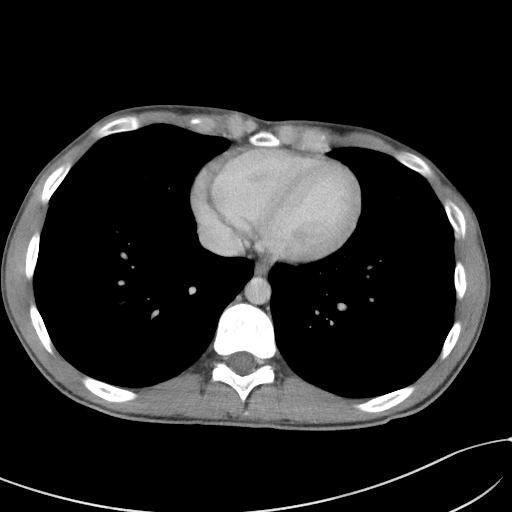

[Series 5: coronal st · coronal · 0.75mm/px · 3 of 82 slices shown]
[im 28/82  soft-tissue]
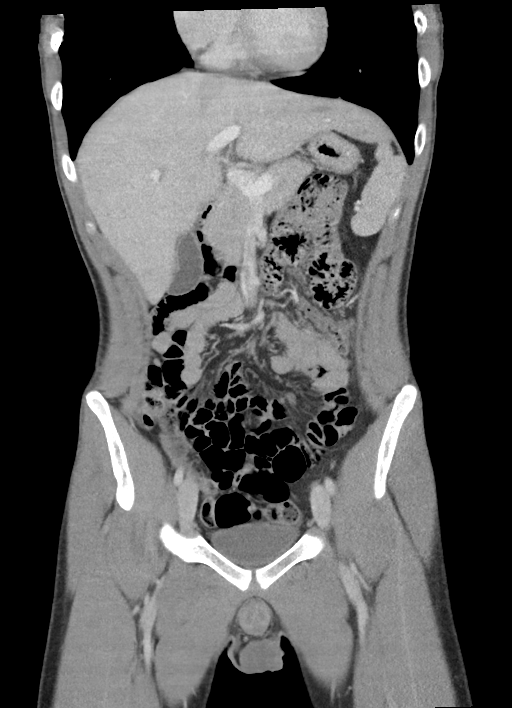
[im 37/82  soft-tissue]
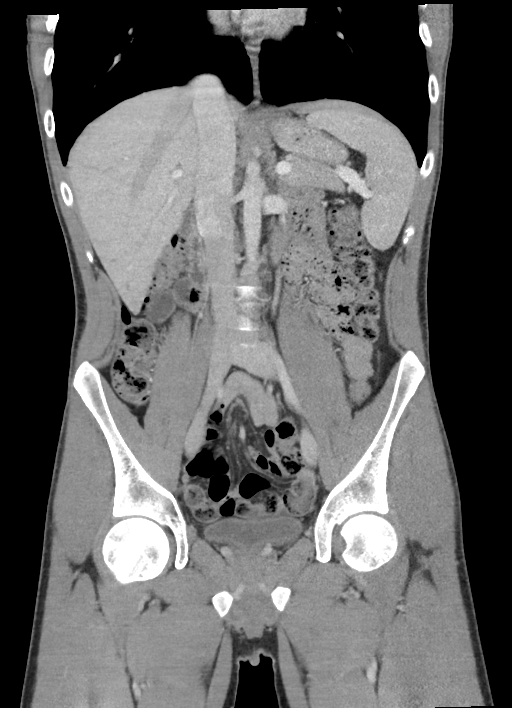
[im 46/82  soft-tissue]
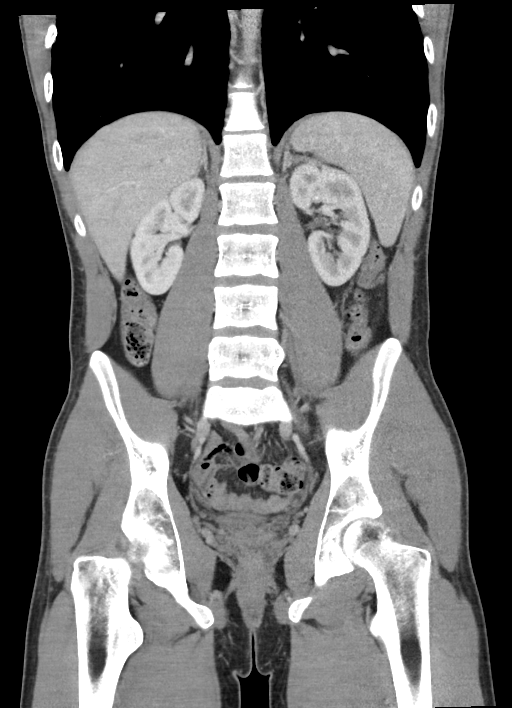

[16 of 46 positions shown; findings below may reference images not displayed]

FINDINGS: Lower chest: Normal heart size. Lung bases are clear. No pleural
effusion.

Hepatobiliary: Liver is normal in size and contour. Gallbladder is
unremarkable. No intrahepatic or extrahepatic biliary ductal
dilatation.

Pancreas: Unremarkable

Spleen: Unremarkable

Adrenals/Urinary Tract: Normal adrenal glands. Kidneys enhance
symmetrically with contrast. No hydronephrosis. Urinary bladder is
unremarkable.

Stomach/Bowel: There are a few mildly thickened loops of small bowel
within the lower abdomen (image 78; series 2). Small amount of
fluid within the pelvis. Normal appendix. No evidence for bowel
obstruction. Normal morphology of the stomach. No free
intraperitoneal air.

Vascular/Lymphatic: Normal caliber abdominal aorta. No
retroperitoneal lymphadenopathy.

Reproductive: Unremarkable.

Other: None.

Musculoskeletal: No aggressive or acute appearing osseous lesions.
IMPRESSION: There a few mildly thickened loops of small bowel within the lower
abdomen with a small amount of adjacent free fluid, most compatible
with enteritis. No evidence for bowel obstruction.

Normal appendix.

## 2022-09-17 IMAGING — DX DG ABDOMEN 1V
2 series · 2 of 2 positions shown · non-contrast
Comparison: None.

CLINICAL DATA: Evaluate for retained agile capsule

EXAM:
ABDOMEN - 1 VIEW

[abdomen kub (1 of 2)]
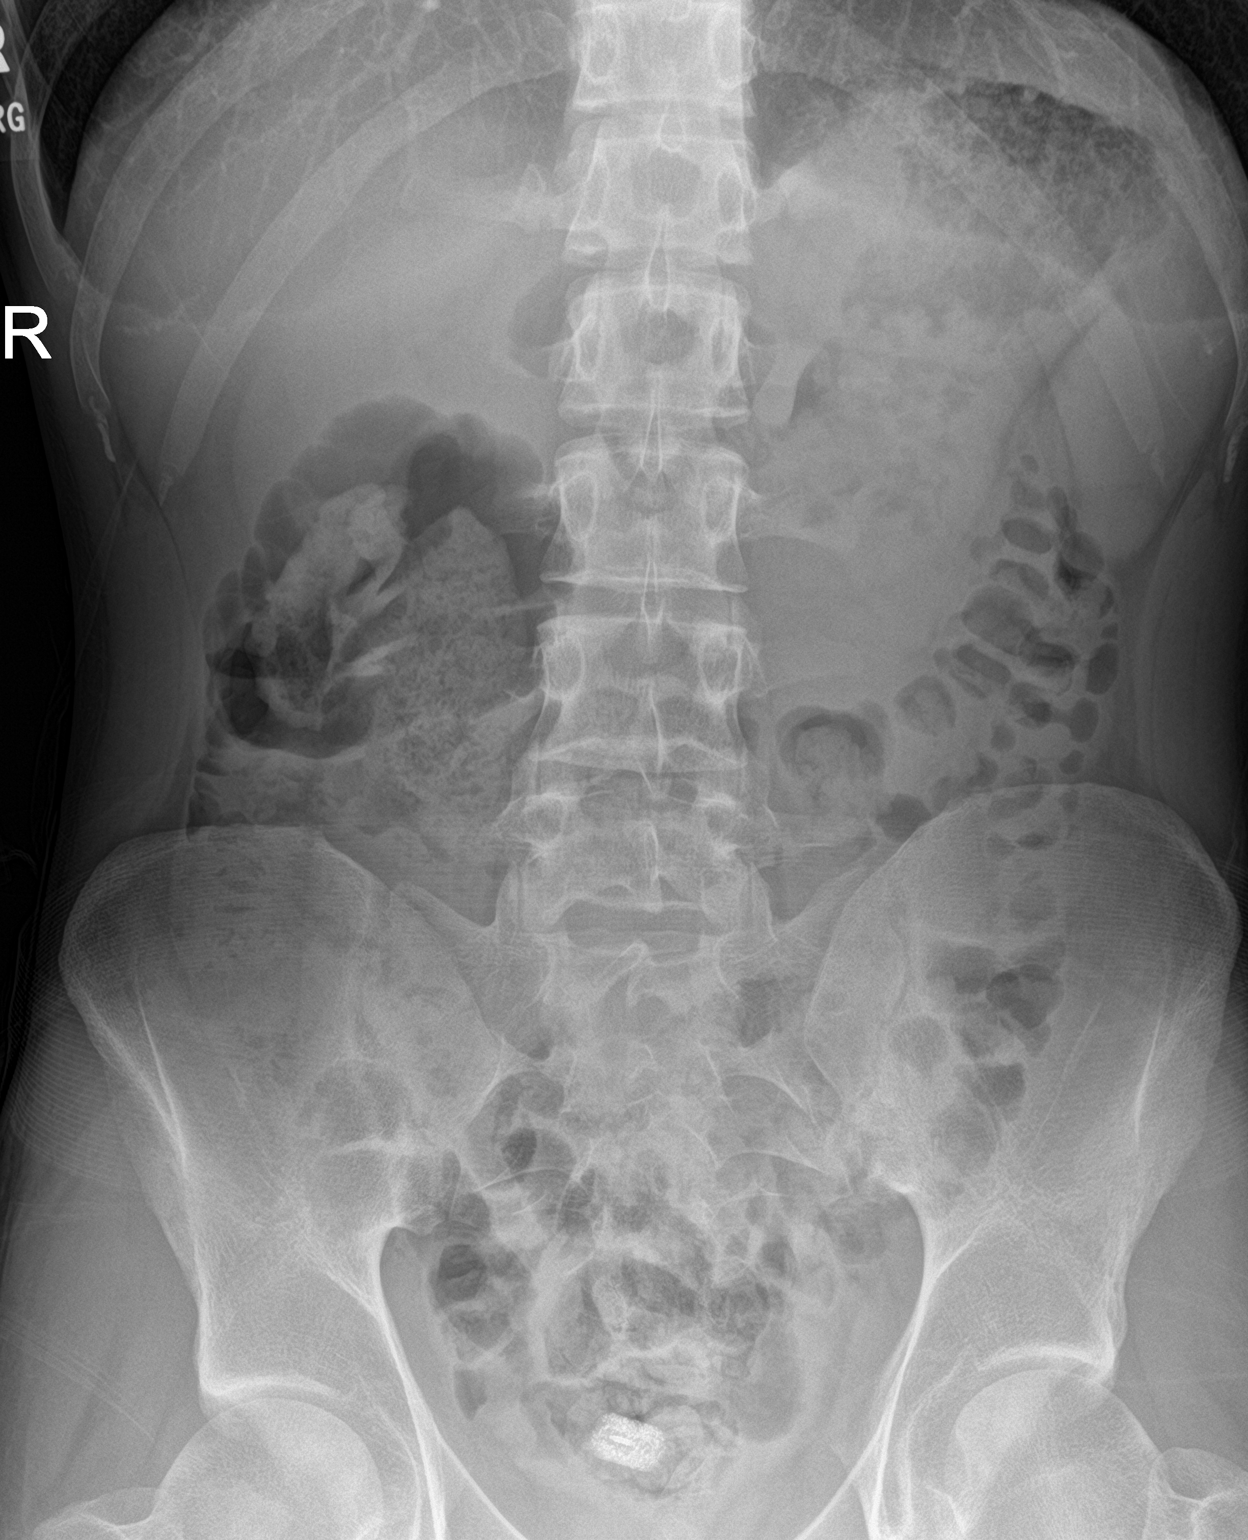

[abdomen kub (2 of 2)]
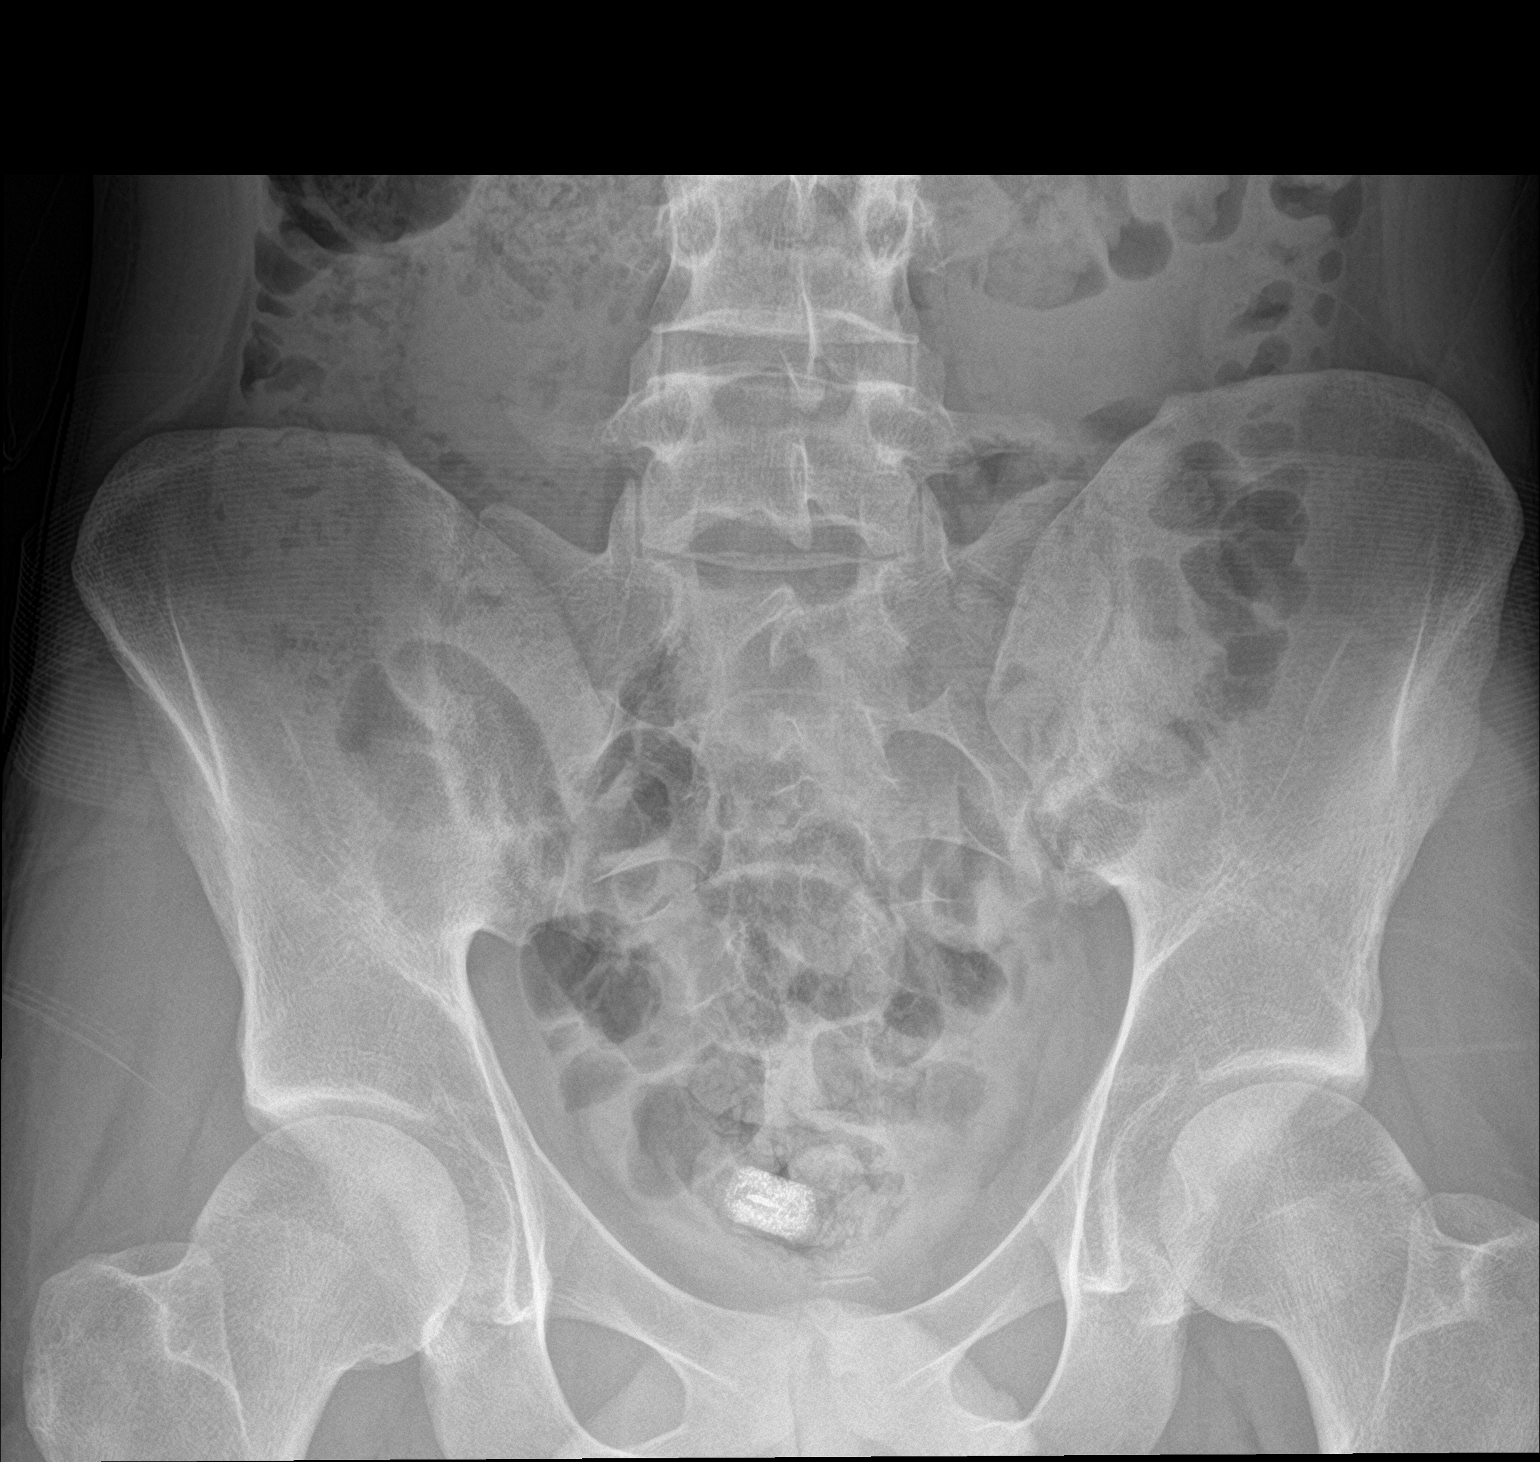

[2 of 2 positions shown; findings below may reference images not displayed]

FINDINGS: Scattered large and small bowel gas is noted. Mild retained fecal
material is noted within the colon. No obstructive changes are seen.
Radiopaque foreign body is noted in the midline of the pelvis
consistent with the ingested agile capsule likely lying within the
rectum.
IMPRESSION: Retained agile capsule felt to be within the distal rectum.

## 2022-11-10 ENCOUNTER — Telehealth: Payer: Self-pay | Admitting: Gastroenterology

## 2022-11-10 NOTE — Telephone Encounter (Signed)
Please advise 

## 2022-11-10 NOTE — Telephone Encounter (Signed)
Dr. Jena Gauss last saw patient. Ok for virtual visit.

## 2022-11-10 NOTE — Telephone Encounter (Signed)
A lady called, left no name, wanted to see if patient could do a telehealth visit ... Needs a refill for amitriptolene.  Please advise.

## 2022-11-11 NOTE — Telephone Encounter (Signed)
Its ok to schedule a virtual with Verlon Au

## 2022-11-12 NOTE — Telephone Encounter (Signed)
Called patient , left a message asking to call back to schedule appt for additional refills

## 2022-11-17 ENCOUNTER — Encounter: Payer: Self-pay | Admitting: Gastroenterology

## 2022-11-17 ENCOUNTER — Telehealth: Payer: Self-pay

## 2022-11-17 ENCOUNTER — Telehealth: Payer: Medicaid Other | Admitting: Gastroenterology

## 2022-11-17 VITALS — Ht 74.0 in | Wt 145.0 lb

## 2022-11-17 DIAGNOSIS — K900 Celiac disease: Secondary | ICD-10-CM

## 2022-11-17 MED ORDER — AMITRIPTYLINE HCL 10 MG PO TABS: ORAL_TABLET | ORAL | 3 refills | Status: DC

## 2022-11-17 MED ORDER — HYOSCYAMINE SULFATE 0.125 MG SL SUBL: 0.1250 mg | SUBLINGUAL_TABLET | Freq: Four times a day (QID) | SUBLINGUAL | 3 refills | Status: DC | PRN

## 2022-11-17 NOTE — Telephone Encounter (Signed)
Gary [name], you are scheduled for a virtual visit with your provider today.  Just as we do with appointments in the office, we must obtain your consent to participate.  Your consent will be active for this visit and any virtual visit you may have with one of our providers in the next 365 days.  If you have a MyChart account, I can also send a copy of this consent to you electronically.  All virtual visits are billed to your insurance company just like a traditional visit in the office.  As this is a virtual visit, video technology does not allow for your provider to perform a traditional examination.  This may limit your provider's ability to fully assess your condition.  If your provider identifies any concerns that need to be evaluated in person or the need to arrange testing such as labs, EKG, etc, we will make arrangements to do so.  Although advances in technology are sophisticated, we cannot ensure that it will always work on either your end or our end.  If the connection with a video visit is poor, we may have to switch to a telephone visit.  With either a video or telephone visit, we are not always able to ensure that we have a secure connection.   I need to obtain your verbal consent now.   Are you willing to proceed with your visit today? Pt consent to video visit. Heloise Beecham, CMA

## 2022-11-17 NOTE — Progress Notes (Signed)
Primary Care Physician:  Royann Shivers, PA-C Primary GI:  Roetta Sessions, MD Patient Location: Home Provider Location: [facility] office  Reason for Visit:  Chief Complaint  Patient presents with   Follow-up    Follow up on refills. Pt was referred to a rheumatologist and wanted to talk to you about it    Persons present on the virtual encounter, with roles: Patient, myself (provider),Zenobia "Dena" Broadnax (updated meds and allergies)  Total time (minutes) spent on medical discussion: 20 minutes  Due to COVID-19, visit was conducted using Mychart video method.  Visit was requested by patient.  Virtual Visit via Mychart video  I connected with [name] on 11/17/22 at  4:00 PM EST by Mychart video and verified that I am speaking with the correct person using two identifiers.   I discussed the limitations, risks, security and privacy concerns of performing an evaluation and management service by telephone/video and the availability of in person appointments. I also discussed with the patient that there may be a patient responsible charge related to this service. The patient expressed understanding and agreed to proceed.   HPI:   Gary [name] is a 22 y.o. male who presents for virtual visit regarding follow up of celiac disease. Last seen 05/2021. Recently seen for possible bilateral sacroiliitis by rheumatology. HLA B27 pending. Father with RA and psoriasis.   He was diagnosed with celiac disease based on positive DQ 2 haplotype, TTG IgA, and endomysial antibody. He was very resistant to EGD with small bowel biopsy previously.   Clinically doing well. Denies any GI symptoms. No heartburn. BMs are normal. No melena, brbpr. No abdominal pain. He continues to use Levis 1/2 to 1 tablet sublingual when needed for abdominal cramping or loose stool but notes he rarely has to take it. Takes amitriptyline 10 mg nightly. Overall feels great. He notes that he has not been able to  run as much as before due to hip pain. He had recent xrays of bilateral SI joints and notes to have erosions of both SI joints concerning for sacroiliitis. Saw rheumatology who mentioned concern for form of spondyloarthropathy since his father has psoriasis.   Patient states the idea of injectable medications was briefly discussed. He is concerned that this may not be needed at this time. He notes his pain is in both SI joints, worse with running. Typically better with rest. He has avoided NSAIDs due to his celiac disease and other GI issues.    He had quit running for now. He is using stationary bike instead. 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          Current Outpatient Medications  Medication Sig Dispense Refill   acidophilus (RISAQUAD) CAPS capsule Take 1 capsule by mouth daily.     amitriptyline (ELAVIL) 10 MG tablet TAKE 1 TABLET BY MOUTH EVERYDAY AT BEDTIME 90 tablet 0   hyoscyamine (LEVSIN SL) 0.125 MG SL tablet Place 1 tablet (0.125 mg total) under the tongue every 6 (six) hours as needed. For lower abdominal pain and diarrhea 120 tablet 3   Multiple Vitamin (MULTIVITAMIN) tablet Take 1 tablet by mouth daily.     ondansetron (ZOFRAN-ODT) 4 MG disintegrating tablet Take 1 tablet (4 mg total) by mouth every 6 (six) hours as needed for nausea or vomiting. 30 tablet 1   No current facility-administered medications for this visit.    Past Medical History:  Diagnosis Date   Allergy to alpha-gal  Anemia    Celiac disease     Past Surgical History:  Procedure Laterality Date   AGILE CAPSULE N/A 08/23/2020   Procedure: AGILE CAPSULE;  Surgeon: Corbin Ade, MD;  Location: [facility];  Service: Endoscopy;  Laterality: N/A;  7:30am   GIVENS CAPSULE STUDY N/A 01/15/2021   Procedure: GIVENS CAPSULE STUDY;  Surgeon: Corbin Ade, MD;  Location: [facility];  Service: Endoscopy;  Laterality: N/A;  7:30am    Family History  Problem Relation Age of Onset   Lupus Mother     Psoriasis Father    Celiac disease Brother    Irritable bowel syndrome Brother    Lupus Paternal Grandfather    Henoch-Schonlein purpura Brother    Colon cancer Neg Hx    Crohn's disease Neg Hx     Social History   Socioeconomic History   Marital status: Single    Spouse name: Not on file   Number of children: Not on file   Years of education: Not on file   Highest education level: Not on file  Occupational History   Not on file  Tobacco Use   Smoking status: Never   Smokeless tobacco: Never  Substance and Sexual Activity   Alcohol use: Never   Drug use: Never   Sexual activity: Yes  Other Topics Concern   Not on file  Social History Narrative   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. Lives with mom and dad. Inside dog and 1 outside dog   Social Determinants of Health   Financial Resource Strain: Not on file  Food Insecurity: Low Risk  (10/15/2022)   Received from [facility]   Hunger Vital Sign    Worried About Running Out of Food in the Last Year: Never true    Ran Out of Food in the Last Year: Never true  Transportation Needs: No Transportation Needs (10/15/2022)   Received from [facility]    In the past 12 months, has lack of reliable transportation kept you from medical appointments, meetings, work or from getting things needed for daily living? : No  Physical Activity: Not on file  Stress: Not on file  Social Connections: Not on file  Intimate Partner Violence: Not on file      ROS:  General: Negative for anorexia, weight loss, fever, chills, fatigue, weakness. Eyes: Negative for vision changes.  ENT: Negative for hoarseness, difficulty swallowing , nasal congestion. CV: Negative for chest pain, angina, palpitations, dyspnea on exertion, peripheral edema.  Respiratory: Negative for dyspnea at rest, dyspnea on exertion, cough, sputum, wheezing.  GI: See history of present illness. GU:  Negative for dysuria, hematuria,  urinary incontinence, urinary frequency, nocturnal urination.  MS: see hpi Derm: Negative for rash or itching.  Neuro: Negative for weakness, abnormal sensation, seizure, frequent headaches, memory loss, confusion.  Psych: Negative for anxiety, depression, suicidal ideation, hallucinations.  Endo: Negative for unusual weight change.  Heme: Negative for bruising or bleeding. Allergy: Negative for rash or hives.   Observations/Objective:  Appears well. No acute distress.   Assessment and Plan:  Celiac: -Clinically doing well. 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  -Need to update TTG IGA if patient willing, to address after HLA B27 results are available.  -Continue to avoid all gluten  Erosions of bilateral sacroiliac joints concerning for sacroiliitis: -recent evaluation  by rheumatology -HLA B27 pending -possible related to his celiac disease  Follow Up Instructions:    I discussed the assessment and treatment plan with the patient. The patient was provided an opportunity to ask questions and all were answered. The patient agreed with the plan and demonstrated an understanding of the instructions. AVS mailed to patient's home address.   The patient was advised to call back or seek an in-person evaluation if the symptoms worsen or if the condition fails to improve as anticipated.  I provided 20 minutes of virtual face-to-face time during this encounter.   Tana Coast, PA-C

## 2022-11-17 NOTE — Patient Instructions (Addendum)
Continue levsin 1/2 to 1 tablet sublingual every six hours as needed. Continue amitriptyline 10mg  at bedtime.  I will follow up with you by mychart or phone after your rheumatology results are available.

## 2023-01-15 DIAGNOSIS — Z1589 Genetic susceptibility to other disease: Secondary | ICD-10-CM | POA: Diagnosis not present

## 2023-01-15 DIAGNOSIS — M533 Sacrococcygeal disorders, not elsewhere classified: Secondary | ICD-10-CM | POA: Diagnosis not present

## 2023-01-31 DIAGNOSIS — J018 Other acute sinusitis: Secondary | ICD-10-CM | POA: Diagnosis not present

## 2023-02-01 DIAGNOSIS — H9203 Otalgia, bilateral: Secondary | ICD-10-CM | POA: Diagnosis not present

## 2023-02-18 ENCOUNTER — Other Ambulatory Visit: Payer: Self-pay | Admitting: Gastroenterology

## 2023-02-18 NOTE — Telephone Encounter (Signed)
 Phoned and LMOVM for the pt that his Rx was sent in to his pharmacy

## 2023-02-19 ENCOUNTER — Other Ambulatory Visit: Payer: Self-pay | Admitting: Gastroenterology

## 2023-02-19 MED ORDER — AMITRIPTYLINE HCL 10 MG PO TABS: ORAL_TABLET | ORAL | 3 refills | Status: DC

## 2023-03-17 DIAGNOSIS — R0989 Other specified symptoms and signs involving the circulatory and respiratory systems: Secondary | ICD-10-CM | POA: Diagnosis not present

## 2023-03-17 DIAGNOSIS — R52 Pain, unspecified: Secondary | ICD-10-CM | POA: Diagnosis not present

## 2023-03-17 DIAGNOSIS — R059 Cough, unspecified: Secondary | ICD-10-CM | POA: Diagnosis not present

## 2023-03-24 DIAGNOSIS — M25562 Pain in left knee: Secondary | ICD-10-CM | POA: Diagnosis not present

## 2023-04-09 DIAGNOSIS — M533 Sacrococcygeal disorders, not elsewhere classified: Secondary | ICD-10-CM | POA: Diagnosis not present

## 2023-05-31 DIAGNOSIS — R197 Diarrhea, unspecified: Secondary | ICD-10-CM | POA: Diagnosis not present

## 2023-05-31 DIAGNOSIS — M545 Low back pain, unspecified: Secondary | ICD-10-CM | POA: Diagnosis not present

## 2023-05-31 DIAGNOSIS — K900 Celiac disease: Secondary | ICD-10-CM | POA: Diagnosis not present

## 2023-06-13 DIAGNOSIS — F411 Generalized anxiety disorder: Secondary | ICD-10-CM | POA: Diagnosis not present

## 2023-06-25 DIAGNOSIS — Z1589 Genetic susceptibility to other disease: Secondary | ICD-10-CM | POA: Diagnosis not present

## 2023-06-25 DIAGNOSIS — K900 Celiac disease: Secondary | ICD-10-CM | POA: Diagnosis not present

## 2023-06-25 DIAGNOSIS — M461 Sacroiliitis, not elsewhere classified: Secondary | ICD-10-CM | POA: Diagnosis not present

## 2023-06-25 DIAGNOSIS — M450 Ankylosing spondylitis of multiple sites in spine: Secondary | ICD-10-CM | POA: Diagnosis not present

## 2023-11-06 ENCOUNTER — Encounter: Payer: Self-pay | Admitting: Gastroenterology

## 2023-11-06 ENCOUNTER — Ambulatory Visit: Payer: Self-pay | Admitting: Gastroenterology

## 2023-11-06 VITALS — BP 122/76 | HR 86 | Temp 97.6°F | Ht 74.0 in | Wt 145.6 lb

## 2023-11-06 DIAGNOSIS — G8929 Other chronic pain: Secondary | ICD-10-CM

## 2023-11-06 DIAGNOSIS — R109 Unspecified abdominal pain: Secondary | ICD-10-CM

## 2023-11-06 DIAGNOSIS — K900 Celiac disease: Secondary | ICD-10-CM

## 2023-11-06 DIAGNOSIS — R197 Diarrhea, unspecified: Secondary | ICD-10-CM

## 2023-11-06 MED ORDER — AMITRIPTYLINE HCL 10 MG PO TABS: ORAL_TABLET | ORAL | 3 refills | Status: AC

## 2023-11-06 NOTE — Patient Instructions (Signed)
 Complete labs today. We will be in touch with results.   Continue gluten free diet.   I have sent in refills for amitriptyline  for one year supply.  Return to the office in one year or sooner if needed.

## 2023-11-06 NOTE — Progress Notes (Signed)
 GI Office Note    Referring Provider: Jeanette Comer BRAVO, * Primary Care Physician:  Skillman, Katherine E, PA-C  Primary Gastroenterologist: Ozell Hollingshead, MD   Chief Complaint   Chief Complaint  Patient presents with   Follow-up    History of Present Illness   Gary [name] is a 23 y.o. male presenting today for follow up of celiac disease. Last seen 11/2022, virtual visit. He was diagnosed with celiac disease based on positive DQ 2 haplotype, TTG IgA, and endomysial antibody. Patient has previously declined EGD with small bowel biopsy previously. Small bowel capsule endoscopy 01/15/2021 due to history of abnormal distal small bowel on CT (nonspecific appearance but had plans for video  capsule to evaluate for possible IBD), was normal. His brother has biopsy confirmed celiac disease. Patient also has history of Alpha Gal.   Discussed the use of AI scribe software for clinical note transcription with the patient, who gave verbal consent to proceed.  He has ongoing issues with ankylosing spondylitis, diagnosed officially six months ago. He started Humira approximately twelve weeks ago, which has helped significantly, although he still experiences daily pain, particularly in his back, chest, and ribs. There is a lack of flexibility in his lower back and severe pain in his SI joints. He has been unable to obtain an MRI due to insurance issues. He is followed by Dr. Dollene, rheumatology.  Prior to starting Humira, he was using Levsin  about once per week for abdominal cramping and loose stool. Typically his abdominal cramping is relieved with BM. Levsin  also helps. He has noted since starting Humira, there is a reduction in these symptoms, now using Levsin  approximately twice a month. He maintains a strict diet to prevent gluten exposure due to celiac disease. He rarely eats out. Chronically he has been on amitriptyline  10 mg at bedtime which has been helpful in management of his  abdominal pain. Previously diagnosed with abdominal migraines by pediatric GI. Overall bowel function is good. Really not having a lot of abdominal pain. No blood in the stool or black stools. No UGI symptoms. Weight has been stable.   He uses Celebrex as needed, approximately once or twice a month, for pain management. He is cautious with Celebrex due to potential side effects. Tylenol is also used, although it is ineffective for his back pain.  His family history includes his father having psoriatic arthritis and his mother having lupus. He is HLA B27 positive, which is associated with his ankylosing spondylitis.  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 He finds the physical demands of the job challenging due to his back pain, pain in the SI joints, and sternum.   Review of records from Dr. Leatrice with pediatric GI at [facility]: June 21, 2018 initial consultation -Symptoms meet Rome IV criteria for abdominal migraine.  Episodes are stereotypical.  Begin with abdominal pain of moderate to intense severity. Associated with nausea, aura.  After episodes, wiped out for couple of days.  Episodes have been average of twice per month. Started on amitriptyline  for abdominal migraine prophylaxis.  Patient reported normal EKG previously.  Repeat CBC, CRP, sed rate, celiac, alpha gal, lipase, c-Met.  Start Elavil  10 mg at bedtime.  Labs not available but according to records his labs showed sensitivity to alpha gal but otherwise normal.   08/2018: F/u with pediatric GI for abdominal migraine. Improved but persistent nausea. Amitriptyline  increased to 25 mg at bedtime.  10/2018: F/U with pediatric GI. Plans to continue amitriptyline  at current dose for six months, then wean back to 10mg  daily.   08/2019: F/U with pediatric GI. Patient had stopped amitriptyline  on his own for increased heart rate and heat intolerant. Started on cyproheptadine .    11/2019: F/U with pediatric GI. Saw no  benefit with cyproheptadine . Persistent nausea, and fatigue. Offered EGD/colonoscopy with disaccharidases.    Medications   Current Outpatient Medications  Medication Sig Dispense Refill   acidophilus (RISAQUAD) CAPS capsule Take 1 capsule by mouth daily.     celecoxib (CELEBREX) 200 MG capsule Take 200 mg by mouth daily as needed.     HUMIRA, 2 PEN, 40 MG/0.4ML pen SMARTSIG:40 Milligram(s) SUB-Q Every 2 Weeks     hyoscyamine  (LEVSIN  SL) 0.125 MG SL tablet PLACE 1 TABLET (0.125 MG TOTAL) UNDER THE TONGUE EVERY 6 (SIX) HOURS AS NEEDED. FOR LOWER ABDOMINAL PAIN AND DIARRHEA 360 tablet 1   Multiple Vitamin (MULTIVITAMIN) tablet Take 1 tablet by mouth daily.     ondansetron  (ZOFRAN -ODT) 4 MG disintegrating tablet Take 1 tablet (4 mg total) by mouth every 6 (six) hours as needed for nausea or vomiting. 30 tablet 1   amitriptyline  (ELAVIL ) 10 MG tablet TAKE 1 TABLET BY MOUTH EVERYDAY AT BEDTIME 90 tablet 3   No current facility-administered medications for this visit.    Allergies   Allergies as of 11/06/2023 - Review Complete 11/06/2023  Allergen Reaction Noted   Codeine Other (See Comments) 10/22/2020   Gluten meal  09/05/2019   Lactose Diarrhea 10/16/2022   Other  09/05/2019          Review of Systems   General: Negative for anorexia, weight loss, fever, chills, fatigue, weakness. ENT: Negative for hoarseness, difficulty swallowing , nasal congestion. CV: Negative for chest pain, angina, palpitations, dyspnea on exertion, peripheral edema.  Respiratory: Negative for dyspnea at rest, dyspnea on exertion, cough, sputum, wheezing.  GI: See history of present illness. GU:  Negative for dysuria, hematuria, urinary incontinence, urinary frequency, nocturnal urination.  Endo: Negative for unusual weight change.     Physical Exam   BP 122/76 (BP Location: Right Arm, Patient Position: Sitting, Cuff Size: Normal)   Pulse 86   Temp 97.6 F (36.4 C) (Oral)   Ht 6' 2 (1.88 m)   Wt 145  lb 9.6 oz (66 kg)   SpO2 100%   BMI 18.69 kg/m    General: Well-nourished, well-developed in no acute distress.  Eyes: No icterus. Mouth: Oropharyngeal mucosa moist and pink   Abdomen: Bowel sounds are normal, nontender, nondistended, no hepatosplenomegaly or masses,  no abdominal bruits or hernia , no rebound or guarding.  Rectal: not performed Extremities: No lower extremity edema. No clubbing or deformities. Neuro: Alert and oriented x 4   Skin: Warm and dry, no jaundice.   Psych: Alert and cooperative, normal mood and affect.  Labs   No recent labs  Imaging Studies   No results found.  Assessment/Plan:     Celiac disease: - Order TTG IgA, vitamin D, B12, folate, CBC, and iron levels - continue gluten free diet - consider baseline DEXA bone density study in the next year or so especially if any signs of vitamin deficiency or persistent TTG IgA elevation - Discuss pneumococcal vaccine with primary care provider due to hyposplenism that can be seen with celiac disease, choosing appropriate vaccines while on Humira - avoid live vaccines while on Humira - minimize use of NSAIDs as much as possible  from GI and renal standpoint understanding that with his SI and AS he will likely require some NSAID use.   Chronic abdominal pain/loose stools: Chronic intermittent lower abdominal pain that has been associated with loose stools and often relieved by BM. Likely has some underlying IBS, also with history of abdominal migraines diagnosed by pediatric GI years ago. He has done well on amitriptyline  and Levsin . stools. Symptoms have decreased in frequency since starting Humira, now requiring Levsin  approximately twice a month.   - Continue Levsin  as needed for abdominal cramping - Continue amitriptyline  10mg  at bedtime. Previously reporting normal EKG. - Monitor symptoms and consider colonoscopy if symptoms escalate - recheck Alpha Gal titers    Sonny RAMAN. Ezzard, MHS, PA-C [facility]

## 2023-11-09 LAB — CBC WITH DIFFERENTIAL/PLATELET
Basophils Absolute: 0 x10E3/uL (ref 0.0–0.2)
Basos: 0 %
EOS (ABSOLUTE): 0.2 x10E3/uL (ref 0.0–0.4)
Eos: 4 %
Hematocrit: 51.2 % — ABNORMAL HIGH (ref 37.5–51.0)
Hemoglobin: 16.9 g/dL (ref 13.0–17.7)
Immature Grans (Abs): 0 x10E3/uL (ref 0.0–0.1)
Immature Granulocytes: 0 %
Lymphocytes Absolute: 1.8 x10E3/uL (ref 0.7–3.1)
Lymphs: 37 %
MCH: 32.2 pg (ref 26.6–33.0)
MCHC: 33 g/dL (ref 31.5–35.7)
MCV: 98 fL — ABNORMAL HIGH (ref 79–97)
Monocytes Absolute: 0.4 x10E3/uL (ref 0.1–0.9)
Monocytes: 7 %
Neutrophils Absolute: 2.6 x10E3/uL (ref 1.4–7.0)
Neutrophils: 52 %
Platelets: 152 x10E3/uL (ref 150–450)
RBC: 5.25 x10E6/uL (ref 4.14–5.80)
RDW: 11.9 % (ref 11.6–15.4)
WBC: 5 x10E3/uL (ref 3.4–10.8)

## 2023-11-09 LAB — ALPHA-GAL PANEL
Allergen Lamb IgE: <0.1 kU/L
Beef IgE: <0.1 kU/L
IgE (Immunoglobulin E), Serum: 9 [IU]/mL (ref 6–495)
O215-IgE Alpha-Gal: <0.1 kU/L
Pork IgE: <0.1 kU/L

## 2023-11-09 LAB — IRON,TIBC AND FERRITIN PANEL
Ferritin: 81 ng/mL (ref 30–400)
Iron Saturation: 58 % — ABNORMAL HIGH (ref 15–55)
Iron: 188 ug/dL — ABNORMAL HIGH (ref 38–169)
Total Iron Binding Capacity: 326 ug/dL (ref 250–450)
UIBC: 138 ug/dL (ref 111–343)

## 2023-11-09 LAB — B12 AND FOLATE PANEL
Folate: >20 ng/mL (ref >3.0)
Vitamin B-12: 1241 pg/mL (ref 232–1245)

## 2023-11-09 LAB — TISSUE TRANSGLUTAMINASE, IGA: Transglutaminase IgA: 3 U/mL (ref 0–3)

## 2023-11-09 LAB — VITAMIN D 25 HYDROXY (VIT D DEFICIENCY, FRACTURES): Vit D, 25-Hydroxy: 42 ng/mL (ref 30.0–100.0)

## 2023-11-19 ENCOUNTER — Ambulatory Visit: Payer: Self-pay | Admitting: Gastroenterology

## 2023-11-19 DIAGNOSIS — M450 Ankylosing spondylitis of multiple sites in spine: Secondary | ICD-10-CM | POA: Diagnosis not present

## 2023-11-19 DIAGNOSIS — Z1589 Genetic susceptibility to other disease: Secondary | ICD-10-CM | POA: Diagnosis not present

## 2023-11-19 DIAGNOSIS — K900 Celiac disease: Secondary | ICD-10-CM

## 2023-11-19 DIAGNOSIS — Z79899 Other long term (current) drug therapy: Secondary | ICD-10-CM | POA: Diagnosis not present

## 2023-12-15 ENCOUNTER — Encounter: Payer: Self-pay | Admitting: Internal Medicine

## 2023-12-25 DIAGNOSIS — M545 Low back pain, unspecified: Secondary | ICD-10-CM | POA: Diagnosis not present

## 2024-02-01 ENCOUNTER — Other Ambulatory Visit: Payer: Self-pay

## 2024-02-01 DIAGNOSIS — K900 Celiac disease: Secondary | ICD-10-CM
